# Patient Record
Sex: Female | Born: 1980 | Hispanic: Yes | Marital: Single | Smoking: Never smoker
Health system: Southern US, Community
[De-identification: ages and names within clinical notes are randomized; demographics above are authoritative.]

## PROBLEM LIST (undated history)

## (undated) ENCOUNTER — Emergency Department: Discharge status: Absent without leave | Payer: Self-pay

## (undated) ENCOUNTER — Inpatient Hospital Stay: Payer: Self-pay

## (undated) DIAGNOSIS — D649 Anemia, unspecified: Secondary | ICD-10-CM

## (undated) DIAGNOSIS — Z789 Other specified health status: Secondary | ICD-10-CM

## (undated) DIAGNOSIS — F329 Major depressive disorder, single episode, unspecified: Secondary | ICD-10-CM

## (undated) HISTORY — DX: Anemia, unspecified: D64.9

## (undated) HISTORY — DX: Major depressive disorder, single episode, unspecified: F32.9

## (undated) HISTORY — PX: NO PAST SURGERIES: SHX2092

---

## 2011-10-27 ENCOUNTER — Emergency Department: Payer: Self-pay | Admitting: Emergency Medicine

## 2011-10-27 LAB — CBC
HCT: 39.9 % (ref 35.0–47.0)
HGB: 13.2 g/dL (ref 12.0–16.0)
MCH: 28.4 pg (ref 26.0–34.0)
MCHC: 33 g/dL (ref 32.0–36.0)
Platelet: 186 10*3/uL (ref 150–440)
RBC: 4.63 10*6/uL (ref 3.80–5.20)
RDW: 13 % (ref 11.5–14.5)
WBC: 10.3 10*3/uL (ref 3.6–11.0)

## 2011-10-27 LAB — COMPREHENSIVE METABOLIC PANEL
BUN: 7 mg/dL (ref 7–18)
Bilirubin,Total: 0.4 mg/dL (ref 0.2–1.0)
Calcium, Total: 9 mg/dL (ref 8.5–10.1)
EGFR (African American): >60
EGFR (Non-African Amer.): >60
Glucose: 77 mg/dL (ref 65–99)
Osmolality: 274 (ref 275–301)
SGOT(AST): 17 U/L (ref 15–37)
SGPT (ALT): 31 U/L

## 2011-10-27 LAB — URINALYSIS, COMPLETE
Nitrite: NEGATIVE
Ph: 5 (ref 4.5–8.0)
Protein: 30
RBC,UR: NONE SEEN /HPF (ref 0–5)
Specific Gravity: 1.034 (ref 1.003–1.030)
WBC UR: NONE SEEN /HPF (ref 0–5)

## 2011-10-27 LAB — HCG, QUANTITATIVE, PREGNANCY: Beta Hcg, Quant.: 145447 m[IU]/mL — ABNORMAL HIGH

## 2011-11-15 ENCOUNTER — Emergency Department: Payer: Self-pay | Admitting: Emergency Medicine

## 2011-11-15 LAB — URINALYSIS, COMPLETE
Bilirubin,UR: NEGATIVE
Glucose,UR: NEGATIVE mg/dL (ref 0–75)
Protein: 30
RBC,UR: 3 /HPF (ref 0–5)
Squamous Epithelial: 40
WBC UR: 12 /HPF (ref 0–5)

## 2011-11-15 LAB — COMPREHENSIVE METABOLIC PANEL
Albumin: 3.7 g/dL (ref 3.4–5.0)
Alkaline Phosphatase: 78 U/L (ref 50–136)
BUN: 8 mg/dL (ref 7–18)
Bilirubin,Total: 0.3 mg/dL (ref 0.2–1.0)
EGFR (African American): >60
Glucose: 70 mg/dL (ref 65–99)
SGOT(AST): 22 U/L (ref 15–37)
SGPT (ALT): 32 U/L
Sodium: 137 mmol/L (ref 136–145)
Total Protein: 8.3 g/dL — ABNORMAL HIGH (ref 6.4–8.2)

## 2011-11-15 LAB — HCG, QUANTITATIVE, PREGNANCY: Beta Hcg, Quant.: 58452 m[IU]/mL — ABNORMAL HIGH

## 2011-11-15 LAB — CBC
HCT: 38.8 % (ref 35.0–47.0)
HGB: 13.1 g/dL (ref 12.0–16.0)
MCHC: 33.7 g/dL (ref 32.0–36.0)
RBC: 4.51 10*6/uL (ref 3.80–5.20)
WBC: 9.8 10*3/uL (ref 3.6–11.0)

## 2011-12-08 ENCOUNTER — Emergency Department: Payer: Self-pay | Admitting: Emergency Medicine

## 2011-12-08 LAB — URINALYSIS, COMPLETE
Bacteria: NONE SEEN
Bilirubin,UR: NEGATIVE
Glucose,UR: NEGATIVE mg/dL (ref 0–75)
Nitrite: NEGATIVE
Protein: NEGATIVE
Specific Gravity: 1.028 (ref 1.003–1.030)
WBC UR: 7 /HPF (ref 0–5)

## 2011-12-08 LAB — COMPREHENSIVE METABOLIC PANEL
Alkaline Phosphatase: 71 U/L (ref 50–136)
BUN: 7 mg/dL (ref 7–18)
Bilirubin,Total: 0.4 mg/dL (ref 0.2–1.0)
Calcium, Total: 9 mg/dL (ref 8.5–10.1)
Co2: 24 mmol/L (ref 21–32)
Creatinine: 0.34 mg/dL — ABNORMAL LOW (ref 0.60–1.30)
EGFR (Non-African Amer.): >60
Potassium: 4.7 mmol/L (ref 3.5–5.1)
SGPT (ALT): 37 U/L
Total Protein: 7.9 g/dL (ref 6.4–8.2)

## 2011-12-08 LAB — CBC
HGB: 12.9 g/dL (ref 12.0–16.0)
MCH: 30.1 pg (ref 26.0–34.0)
MCHC: 35.6 g/dL (ref 32.0–36.0)
MCV: 84 fL (ref 80–100)
Platelet: 187 10*3/uL (ref 150–440)
RBC: 4.31 10*6/uL (ref 3.80–5.20)
RDW: 13.3 % (ref 11.5–14.5)
WBC: 8.6 10*3/uL (ref 3.6–11.0)

## 2012-05-15 DIAGNOSIS — F32A Depression, unspecified: Secondary | ICD-10-CM

## 2012-05-15 DIAGNOSIS — D649 Anemia, unspecified: Secondary | ICD-10-CM

## 2012-05-15 HISTORY — DX: Depression, unspecified: F32.A

## 2012-05-15 HISTORY — DX: Anemia, unspecified: D64.9

## 2012-05-24 ENCOUNTER — Inpatient Hospital Stay: Payer: Self-pay | Admitting: Obstetrics and Gynecology

## 2012-05-24 LAB — CBC WITH DIFFERENTIAL/PLATELET
Basophil #: 0 10*3/uL (ref 0.0–0.1)
Basophil %: 0.4 %
Eosinophil #: 0.1 10*3/uL (ref 0.0–0.7)
HCT: 31.8 % — ABNORMAL LOW (ref 35.0–47.0)
HGB: 11.1 g/dL — ABNORMAL LOW (ref 12.0–16.0)
Lymphocyte %: 20.3 %
MCH: 29.9 pg (ref 26.0–34.0)
MCHC: 34.8 g/dL (ref 32.0–36.0)
MCV: 86 fL (ref 80–100)
Monocyte %: 5.5 %
Neutrophil %: 73 %
Platelet: 166 10*3/uL (ref 150–440)
RBC: 3.69 10*6/uL — ABNORMAL LOW (ref 3.80–5.20)
RDW: 13 % (ref 11.5–14.5)
WBC: 8.5 10*3/uL (ref 3.6–11.0)

## 2012-05-25 LAB — HEMATOCRIT: HCT: 33.8 % — ABNORMAL LOW (ref 35.0–47.0)

## 2014-01-07 IMAGING — US US OB < 14 WEEKS
1 series · 14 of 26 positions shown · non-contrast
Comparison: none

REASON FOR EXAM: repeated vomiting  10 weeks by dates  mild suprapubic
pain
COMMENTS:

[Series 1: us ob < 14 weeks · 0.26mm/px · 14 of 26 slices shown]
[im 1/26]
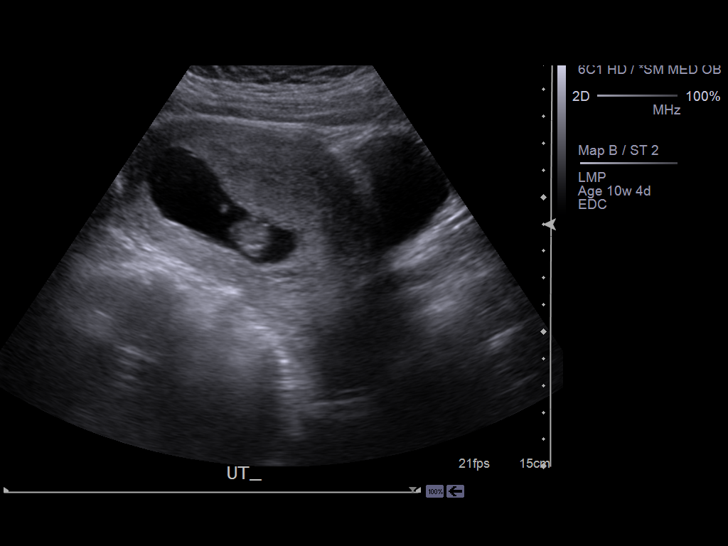
[im 3/26]
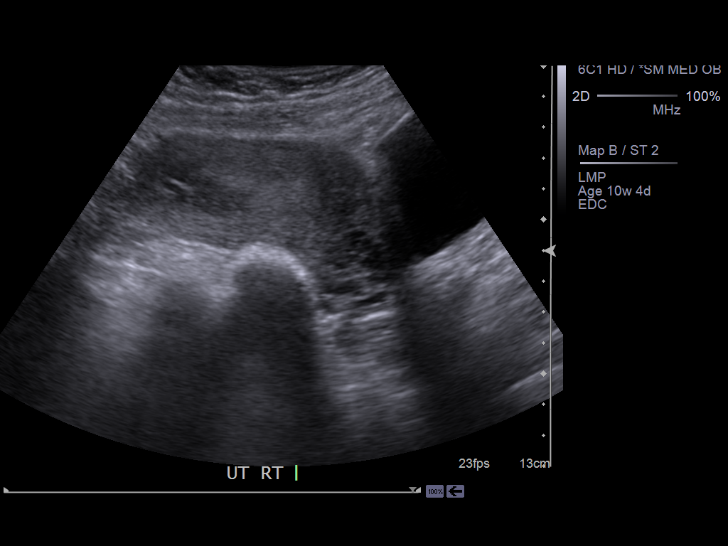
[im 5/26]
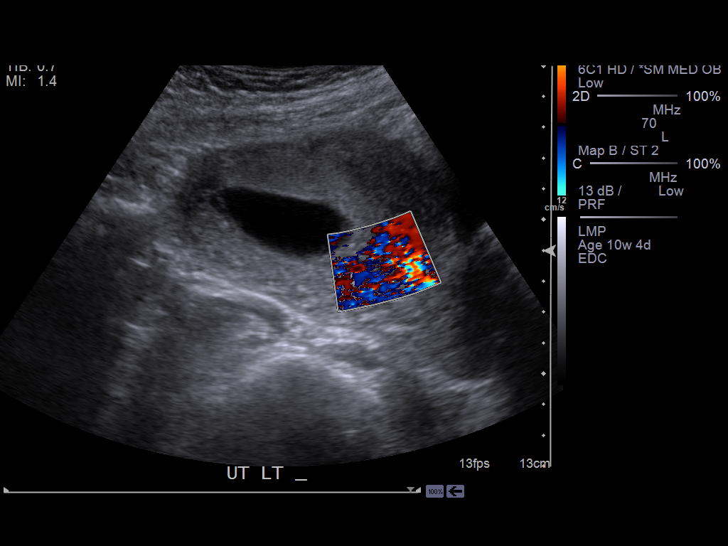
[im 7/26]
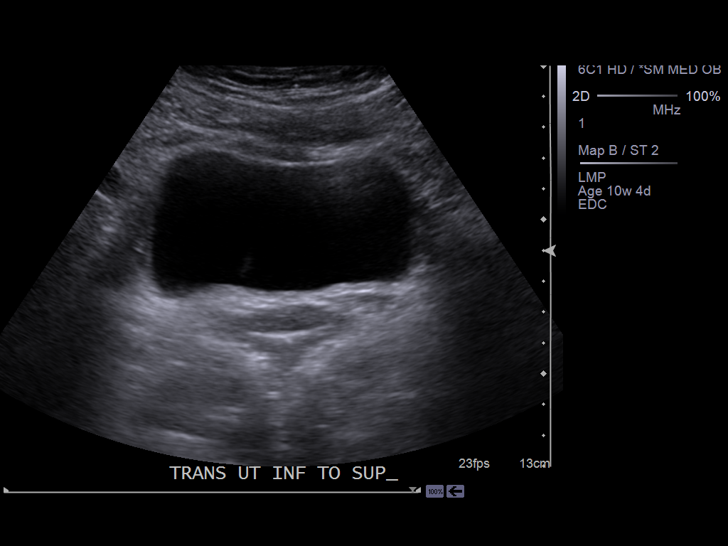
[im 9/26]
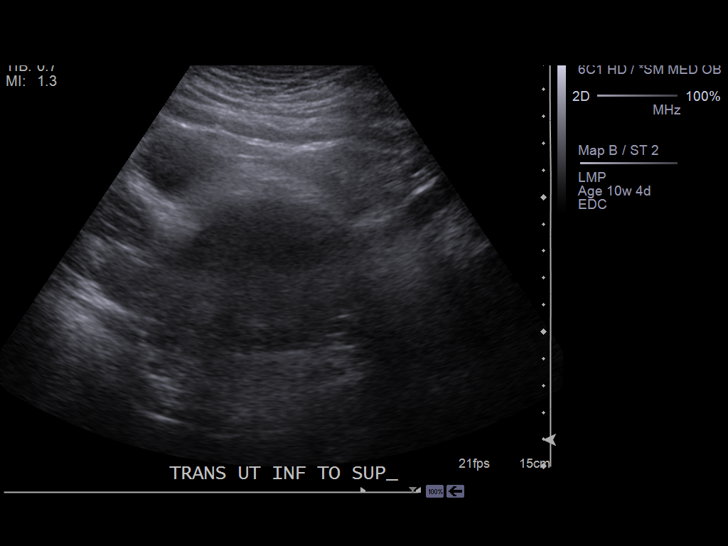
[im 11/26]
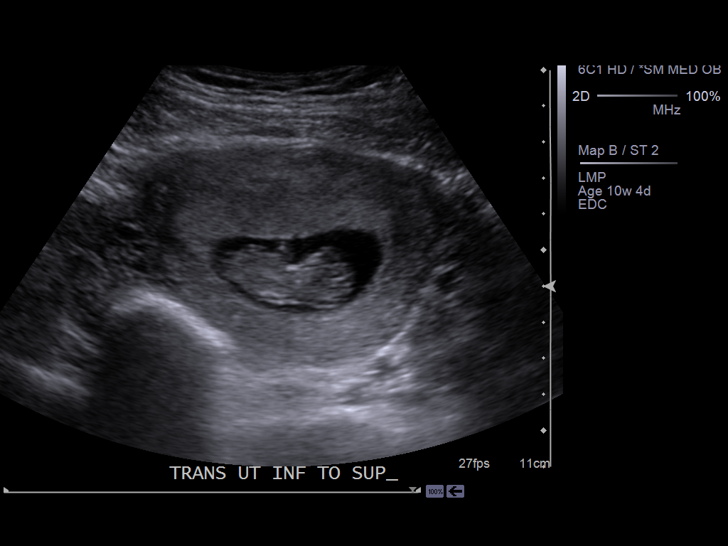
[im 13/26]
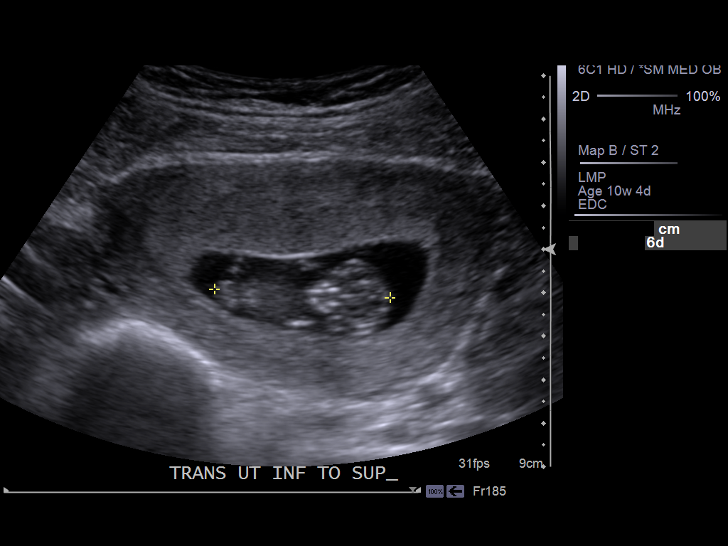
[im 14/26]
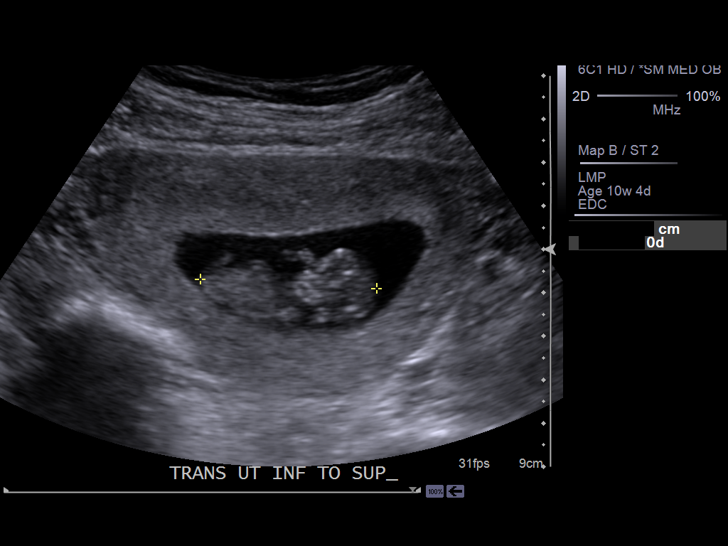
[im 16/26]
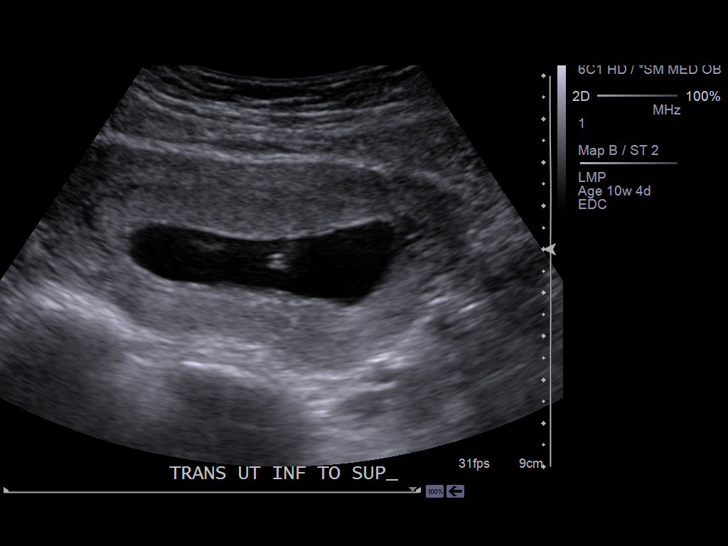
[im 18/26]
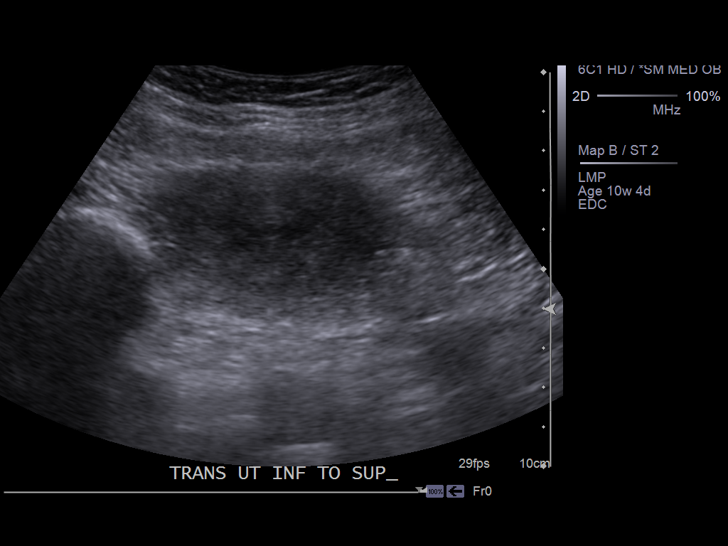
[im 20/26]
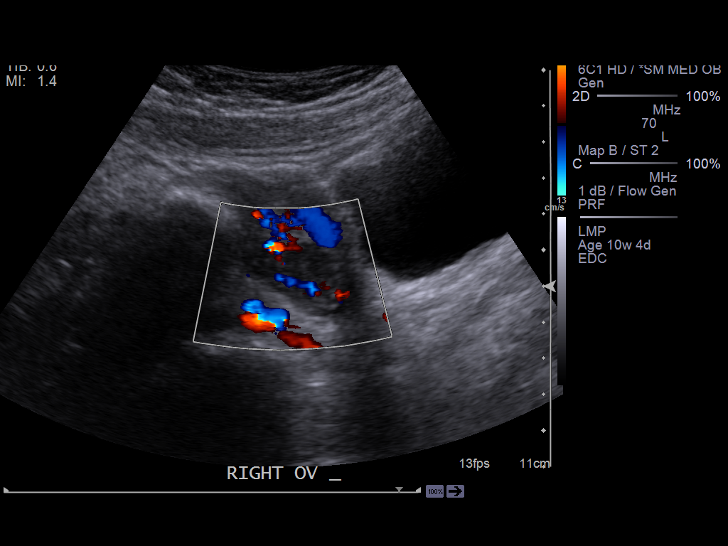
[im 22/26]
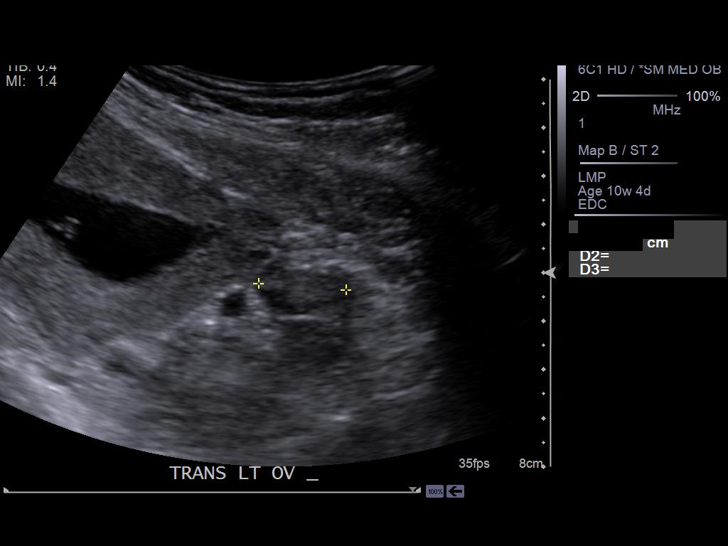
[im 24/26]
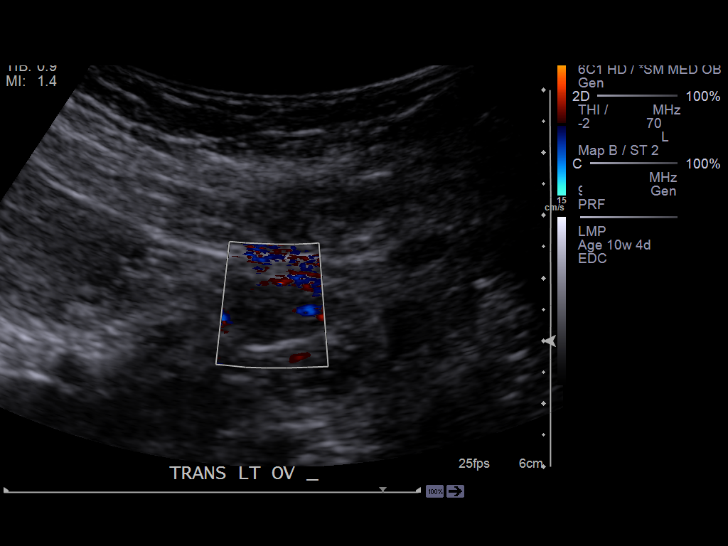
[im 26/26]
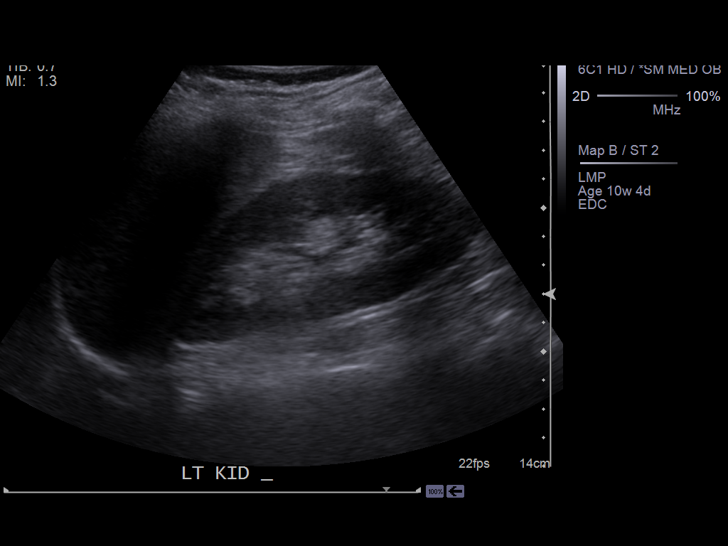

[14 of 26 positions shown; findings below may reference images not displayed]

PROCEDURE:     US  - US OB LESS THAN 14 WEEKS  - October 27, 2011  [DATE]

RESULT:     A gravid uterus is present. The crown-rump length of the fetus
measures 4.06 cm corresponding to an 11 weeks 0 day gestation. A yolk sac is
demonstrated. A fetal cardiac rate of 160 beats per minute was demonstrated.
There is no evidence of a subchorionic hemorrhage.

The maternal ovaries are normal in echotexture and right ovary measures
x 1.6 x 1.6 cm. The left ovary measures 3 x 1.8 x 1 cm. Vascularity of both
ovaries is normal. No suspicious masses are seen in the adnexal regions.
Survey views of the kidneys exhibit no acute abnormality.
IMPRESSION: There is an IUP with estimated gestational age of 11 weeks
0 days plus or minus approximately 7 days. The estimated date of confinement
is May 17, 2012. There is no evidence of a subchorionic hemorrhage nor
maternal adnexal abnormality.

Followup scanning at approximately 20 to 22 weeks would be useful for
biophysical evaluation.

## 2014-09-22 NOTE — H&P (Signed)
L&D Evaluation:  History:   HPI 34 y/[facility] G3P2002 [facility] HX precipitous deliveries x2. Well pregnancy, +CMZ rx'd. Denies leaking fluid, vaginal bleeding or regular contractions, baby is active, GBS negative.    Presents with IOL postdates    Patient's Medical History No Chronic Illness    Patient's Surgical History none    Medications Pre Natal Vitamins    Allergies NKDA    Social History none    Family History Non-Contributory   ROS:   ROS All systems were reviewed.  HEENT, CNS, GI, GU, Respiratory, CV, Renal and Musculoskeletal systems were found to be normal.   Exam:   Vital Signs stable    Urine Protein not completed    General no apparent distress    Mental Status clear    Chest clear    Heart normal sinus rhythm    Abdomen gravid, non-tender    Estimated Fetal Weight Average for gestational age    Fetal Position vtx    Fundal Height term    Back no CVAT    Edema no edema    Reflexes 1+    Clonus negative    Pelvic no external lesions, 3cm 50% vtx @ -2 BOWI sm show    Mebranes Intact    FHT normal rate with no decels, baseline 120's 130's avg variability with accels    FHT Description 132    Ucx irregular, Q 4/6 mins 45/60 sec moderate    Skin dry    Lymph no lymphadenopathy   Impression:   Impression IOL Term + HX precipitous deliveries   Plan:   Plan EFM/NST, monitor contractions and for cervical change    Comments Admitted, explained plan of care to pt and partner (interpreter paged) DC what to expect with IOL, plans natural childbirth/declines IV pain meds-epidural. Will begin pitocin.   Electronic Signatures: Albertina ParrLugiano, Shanley Furlough B (CNM)  (Signed 10-Jan-14 09:23)  Authored: L&D Evaluation   Last Updated: 10-Jan-14 09:23 by Albertina ParrLugiano, Marrah Vanevery B (CNM)

## 2014-10-06 LAB — HM PAP SMEAR: HM Pap smear: NEGATIVE

## 2015-09-06 LAB — HM PAP SMEAR: HM Pap smear: NEGATIVE

## 2016-02-03 ENCOUNTER — Other Ambulatory Visit: Payer: Self-pay | Admitting: Physician Assistant

## 2016-02-03 DIAGNOSIS — Z3481 Encounter for supervision of other normal pregnancy, first trimester: Secondary | ICD-10-CM

## 2016-02-03 DIAGNOSIS — Z3491 Encounter for supervision of normal pregnancy, unspecified, first trimester: Secondary | ICD-10-CM

## 2016-02-03 LAB — OB RESULTS CONSOLE ABO/RH: RH TYPE: POSITIVE

## 2016-02-03 LAB — OB RESULTS CONSOLE HIV ANTIBODY (ROUTINE TESTING): HIV: NONREACTIVE

## 2016-02-03 LAB — OB RESULTS CONSOLE RUBELLA ANTIBODY, IGM: Rubella: IMMUNE

## 2016-02-03 LAB — OB RESULTS CONSOLE VARICELLA ZOSTER ANTIBODY, IGG: Varicella: IMMUNE

## 2016-02-03 LAB — OB RESULTS CONSOLE RPR: RPR: NONREACTIVE

## 2016-02-11 ENCOUNTER — Ambulatory Visit
Admission: RE | Admit: 2016-02-11 | Discharge: 2016-02-11 | Disposition: A | Discharge status: Home / Self Care | Payer: Self-pay | Source: Ambulatory Visit | Attending: Physician Assistant | Admitting: Physician Assistant

## 2016-02-11 DIAGNOSIS — Z3481 Encounter for supervision of other normal pregnancy, first trimester: Secondary | ICD-10-CM | POA: Insufficient documentation

## 2016-02-11 DIAGNOSIS — Z3A16 16 weeks gestation of pregnancy: Secondary | ICD-10-CM | POA: Insufficient documentation

## 2016-02-11 DIAGNOSIS — Z3491 Encounter for supervision of normal pregnancy, unspecified, first trimester: Secondary | ICD-10-CM

## 2016-03-07 ENCOUNTER — Other Ambulatory Visit: Payer: Self-pay | Admitting: Physician Assistant

## 2016-03-07 ENCOUNTER — Ambulatory Visit
Admission: RE | Admit: 2016-03-07 | Discharge: 2016-03-07 | Disposition: A | Discharge status: Home / Self Care | Payer: Self-pay | Source: Ambulatory Visit | Attending: Physician Assistant | Admitting: Physician Assistant

## 2016-03-07 DIAGNOSIS — Z3491 Encounter for supervision of normal pregnancy, unspecified, first trimester: Secondary | ICD-10-CM

## 2016-03-07 DIAGNOSIS — Z3689 Encounter for other specified antenatal screening: Secondary | ICD-10-CM | POA: Insufficient documentation

## 2016-03-07 DIAGNOSIS — Z3A19 19 weeks gestation of pregnancy: Secondary | ICD-10-CM | POA: Insufficient documentation

## 2016-05-15 NOTE — L&D Delivery Note (Signed)
Delivery Note At 6:24 AM on 08/01/16 a viable female was delivered via Vaginal, Spontaneous Delivery (Presentation:ROA ;  ).  APGAR: 8, 9; weight  .  Delayed cord clamping  Placenta status: , .  Cord:  with the following complications: .  Cord pH: not done   Anesthesia:  none Episiotomy: None Lacerations: None Suture Repair: none Est. Blood Loss (mL):  200cc  Mom to postpartum.  Baby to Couplet care / Skin to Skin.  Kaliann Coryell 08/01/2016, 7:09 AM

## 2016-07-08 LAB — OB RESULTS CONSOLE GBS: STREP GROUP B AG: NEGATIVE

## 2016-07-27 ENCOUNTER — Observation Stay
Admission: EM | Admit: 2016-07-27 | Discharge: 2016-07-27 | Disposition: A | Discharge status: Home / Self Care | Payer: Self-pay | Attending: Obstetrics & Gynecology | Admitting: Obstetrics & Gynecology

## 2016-07-27 DIAGNOSIS — O479 False labor, unspecified: Secondary | ICD-10-CM | POA: Diagnosis present

## 2016-07-27 DIAGNOSIS — O99013 Anemia complicating pregnancy, third trimester: Secondary | ICD-10-CM | POA: Insufficient documentation

## 2016-07-27 DIAGNOSIS — O471 False labor at or after 37 completed weeks of gestation: Principal | ICD-10-CM | POA: Insufficient documentation

## 2016-07-27 DIAGNOSIS — Z3A38 38 weeks gestation of pregnancy: Secondary | ICD-10-CM | POA: Insufficient documentation

## 2016-07-27 NOTE — Plan of Care (Signed)
Pt returned from walking . efm reapplied . Small cervical change noted. Dr ward notified. Will continue to monitor

## 2016-07-27 NOTE — Plan of Care (Signed)
Dr ward notified of pt's presence on unit and assessment of labor. Will continue to monitor x 1 more hour and allow pt to ambulate.

## 2016-07-27 NOTE — Progress Notes (Signed)
No cervical change since last  sve. Reactive EFM. Irregular contractions

## 2016-07-27 NOTE — Discharge Summary (Signed)
Theresa Petty is a 36 y.o. female. She is at 3037w5d gestation. Patient's last menstrual period was 10/30/2015 (within days). Estimated Date of Delivery: 08/05/16  [facility]   Chief Complaint: contractions  S: Resting comfortably  Location:uterus Context:  Onset/timing/duration: patient has been having contractions every 7-10 minutes since last night Quality: crampy Severity: moderate Aggravating factors: n/a Alleviating factors: n/a Associated signs/symptoms: no VB.no LOF,  Active fetal movement.    Maternal Medical History:  Anemia  PSH: none  No Known Allergies  Prior to Admission medications   Medication Sig Start Date End Date Taking? Authorizing Provider  Prenatal Vit-Fe Fumarate-FA (MULTIVITAMIN-PRENATAL) 27-0.8 MG TABS tablet Take 1 tablet by mouth daily at 12 noon.   Yes Historical Provider, MD     Social History: She  reports that she has never smoked. She has never used smokeless tobacco. She reports that she does not use drugs.  Family History:  no history of gyn cancers  Review of Systems: A full review of systems was performed and negative except as noted in the HPI.     O:  BP 105/66 (BP Location: Right Arm)   Pulse 78   Temp 98 F (36.7 C) (Oral)   Resp 20   Ht 5\' 1"  (1.549 m)   Wt 67.1 kg (148 lb)   LMP 10/30/2015 (Within Days)   BMI 27.96 kg/m  No results found for this or any previous visit (from the past 48 hour(s)).   Constitutional: NAD, AAOx3  HE/ENT: extraocular movements grossly intact, moist mucous membranes CV: RRR PULM: nl respiratory effort, CTABL     Abd: gravid, non-tender, non-distended, soft      Ext: Non-tender, Nonedmeatous   Psych: mood appropriate, speech normal Pelvic: 3cm - remains unchanged since initial assessment  FHT: 125 mod + accels no decels TOCO: q7 min  A/P:  15XW [id number] G4P3003 with rule out labor .   Labor: not present.   Fetal Wellbeing: Reassuring Cat 1  tracing.  NST reactive  D/c home stable, precautions reviewed, follow-up as scheduled.   ----- Ranae Plumberhelsea Julieta Rogalski, MD Attending Obstetrician and Gynecologist [facility], Department of OB/GYN [facility]

## 2016-07-27 NOTE — Plan of Care (Signed)
Cervical exam = no change. Dr ward notified . Ok to send pt home. Pt d/c home with d/c instructions. Pt verbalized understanding of d/c instructions

## 2016-07-27 NOTE — OB Triage Note (Signed)
Pt had appointment at [facility] today. States they told her her cervix was 3.5 cm dilated and since she has a history of rapid labor to come to l/d .

## 2016-08-01 ENCOUNTER — Inpatient Hospital Stay
Admission: EM | Admit: 2016-08-01 | Discharge: 2016-08-02 | DRG: 775 | Disposition: A | Discharge status: Home / Self Care | Payer: Medicaid Other | Attending: Obstetrics and Gynecology | Admitting: Obstetrics and Gynecology

## 2016-08-01 DIAGNOSIS — Z3A39 39 weeks gestation of pregnancy: Secondary | ICD-10-CM | POA: Diagnosis not present

## 2016-08-01 DIAGNOSIS — Z3493 Encounter for supervision of normal pregnancy, unspecified, third trimester: Secondary | ICD-10-CM | POA: Diagnosis present

## 2016-08-01 HISTORY — DX: Other specified health status: Z78.9

## 2016-08-01 MED ORDER — SIMETHICONE 80 MG PO CHEW: 80.0000 mg | CHEWABLE_TABLET | ORAL | Status: DC | PRN

## 2016-08-01 MED ORDER — MEASLES, MUMPS & RUBELLA VAC ~~LOC~~ INJ
0.5000 mL | INJECTION | Freq: Once | SUBCUTANEOUS | Status: DC
  Filled 2016-08-01: qty 0.5

## 2016-08-01 MED ORDER — AMMONIA AROMATIC IN INHA
RESPIRATORY_TRACT | Status: AC
  Filled 2016-08-01: qty 10

## 2016-08-01 MED ORDER — MAGNESIUM HYDROXIDE 400 MG/5ML PO SUSP: 30.0000 mL | ORAL | Status: DC | PRN

## 2016-08-01 MED ORDER — LIDOCAINE HCL (PF) 1 % IJ SOLN
INTRAMUSCULAR | Status: AC
  Filled 2016-08-01: qty 30

## 2016-08-01 MED ORDER — ZOLPIDEM TARTRATE 5 MG PO TABS: 5.0000 mg | ORAL_TABLET | Freq: Every evening | ORAL | Status: DC | PRN

## 2016-08-01 MED ORDER — ONDANSETRON HCL 4 MG PO TABS: 4.0000 mg | ORAL_TABLET | ORAL | Status: DC | PRN

## 2016-08-01 MED ORDER — COCONUT OIL OIL: 1.0000 "application " | TOPICAL_OIL | Status: DC | PRN

## 2016-08-01 MED ORDER — OXYTOCIN 10 UNIT/ML IJ SOLN
INTRAMUSCULAR | Status: AC
  Filled 2016-08-01: qty 2

## 2016-08-01 MED ORDER — PRENATAL MULTIVITAMIN CH
1.0000 | ORAL_TABLET | Freq: Every day | ORAL | Status: DC
  Administered 2016-08-01 – 2016-08-02 (×2): 1 via ORAL
  Filled 2016-08-01 (×2): qty 1

## 2016-08-01 MED ORDER — SENNOSIDES-DOCUSATE SODIUM 8.6-50 MG PO TABS
2.0000 | ORAL_TABLET | ORAL | Status: DC
  Administered 2016-08-01: 2 via ORAL
  Filled 2016-08-01: qty 2

## 2016-08-01 MED ORDER — WITCH HAZEL-GLYCERIN EX PADS: 1.0000 "application " | MEDICATED_PAD | CUTANEOUS | Status: DC | PRN

## 2016-08-01 MED ORDER — ACETAMINOPHEN 325 MG PO TABS: 650.0000 mg | ORAL_TABLET | ORAL | Status: DC | PRN

## 2016-08-01 MED ORDER — IBUPROFEN 600 MG PO TABS
ORAL_TABLET | ORAL | Status: AC
  Filled 2016-08-01: qty 1

## 2016-08-01 MED ORDER — OXYTOCIN 40 UNITS IN LACTATED RINGERS INFUSION - SIMPLE MED
INTRAVENOUS | Status: AC
  Administered 2016-08-01: 40 [IU]
  Filled 2016-08-01: qty 1000

## 2016-08-01 MED ORDER — ONDANSETRON HCL 4 MG/2ML IJ SOLN: 4.0000 mg | INTRAMUSCULAR | Status: DC | PRN

## 2016-08-01 MED ORDER — BENZOCAINE-MENTHOL 20-0.5 % EX AERO: 1.0000 "application " | INHALATION_SPRAY | CUTANEOUS | Status: DC | PRN

## 2016-08-01 MED ORDER — DIPHENHYDRAMINE HCL 25 MG PO CAPS: 25.0000 mg | ORAL_CAPSULE | Freq: Four times a day (QID) | ORAL | Status: DC | PRN

## 2016-08-01 MED ORDER — IBUPROFEN 600 MG PO TABS
600.0000 mg | ORAL_TABLET | Freq: Four times a day (QID) | ORAL | Status: DC
  Administered 2016-08-01 – 2016-08-02 (×5): 600 mg via ORAL
  Filled 2016-08-01 (×4): qty 1

## 2016-08-01 MED ORDER — MISOPROSTOL 200 MCG PO TABS
ORAL_TABLET | ORAL | Status: AC
  Filled 2016-08-01: qty 4

## 2016-08-01 MED ORDER — FERROUS SULFATE 325 (65 FE) MG PO TABS
325.0000 mg | ORAL_TABLET | Freq: Two times a day (BID) | ORAL | Status: DC
  Administered 2016-08-01 – 2016-08-02 (×2): 325 mg via ORAL
  Filled 2016-08-01 (×2): qty 1

## 2016-08-01 MED ORDER — DIBUCAINE 1 % RE OINT: 1.0000 "application " | TOPICAL_OINTMENT | RECTAL | Status: DC | PRN

## 2016-08-01 NOTE — Discharge Summary (Signed)
Obstetric Discharge Summary   Patient ID: Patient Name: Theresa Petty DOB: 07-Jul-1980 MRN: [id number]  Date of Admission: 08/01/2016 Date of Discharge: 08/02/16  [facility]  Gestational Age at Delivery: 7554w3d   Antepartum complications: depression illiterate  Admitting Diagnosis: active labor   Secondary Diagnoses: Patient Active Problem List   Diagnosis Date Noted  . Vaginal delivery 08/01/2016  . Irregular uterine contractions 07/27/2016  . Labor and delivery, indication for care 07/27/2016    Augmentation: None Complications: None Intrapartum complications/course: Precipitous SVD  Date of Delivery: 08/01/2016 Delivered By: Dr Feliberto GottronSchermerhorn Delivery Type: spontaneous vaginal delivery Anesthesia: epidural Placenta: sponatneous Laceration:  Episiotomy: none  Newborn Data: Live born female  Birth Weight: 9 lb 1.7 oz (4130 g) APGAR: 8, 9      Postpartum Course  Patient had an uncomplicated postpartum course.  By time of discharge on PPD#1, her pain was controlled on oral pain medications; she had appropriate lochia and was ambulating, voiding without difficulty and tolerating regular diet.  She was deemed stable for discharge to home.     Labs: CBC Latest Ref Rng & Units 08/02/2016 05/25/2012 05/24/2012  WBC 3.6 - 11.0 K/uL 10.0 - 8.5  Hemoglobin 12.0 - 16.0 g/dL 11.1(L) - 11.1(L)  Hematocrit 35.0 - 47.0 % 31.9(L) 33.8(L) 31.8(L)  Platelets 150 - 440 K/uL 114(L) - 166   A  Physical exam:  BP 126/78 (BP Location: Right Arm)   Pulse 61   Temp 98.4 F (36.9 C) (Oral)   Resp 18   Ht 5\' 2"  (1.575 m)   Wt 77.1 kg (170 lb)   LMP 10/30/2015 (Within Days)   SpO2 100%   Breastfeeding? Unknown   BMI 31.09 kg/m  General: alert and no distress Pulm: normal respiratory effort Lochia: appropriate Abdomen: soft, NT Uterine Fundus: firm, below umbilicus Extremities: No evidence of DVT seen on physical exam. No lower extremity  edema.   Disposition: stable, discharge to home Baby Feeding: breastmilk  Baby Disposition: home with mom  Contraception: TBD  Prenatal Labs:   ABO, Rh: A/Positive/-- (09/21 0000) Antibody:   Rubella: Immune (09/21 0000) RPR: Nonreactive (09/21 0000)  HBsAg:    HIV: Non-reactive (09/21 0000)  GBS: Negative (02/24 0000)   Plan:  Theresa Petty was discharged to home in good condition. Follow-up appointment at  [facility] in 6 weeks  Discharge Instructions: Per After Visit Summary. Activity: Advance as tolerated. Pelvic rest for 6 weeks.  Refer to After Visit Summary Diet: Regular Discharge Medications: Allergies as of 08/02/2016   No Known Allergies     Medication List    TAKE these medications   ibuprofen 600 MG tablet Commonly known as:  ADVIL,MOTRIN Take 1 tablet (600 mg total) by mouth every 6 (six) hours.   multivitamin-prenatal 27-0.8 MG Tabs tablet Take 1 tablet by mouth daily at 12 noon.      Outpatient follow up:  Follow-up Information    [facility] Follow [facility] in 6 week(s).   Contact information: [address] [place] [id number] [phone]            Signed:  Elenora FenderChelsea C Danett Palazzo 08/02/16

## 2016-08-01 NOTE — H&P (Signed)
Deliliah Langston MaskerJaimes Aviles is a 36 y.o. female presenting for active labor . OB History    Gravida Para Term Preterm AB Living   4 3       3    SAB TAB Ectopic Multiple Live Births           3     Past Medical History:  Diagnosis Date  . Medical history non-contributory    Past Surgical History:  Procedure Laterality Date  . NO PAST SURGERIES     Family History: family history is not on file. Social History:  reports that she has never smoked. She has never used smokeless tobacco. She reports that she does not drink alcohol or use drugs.     Maternal Diabetes: No Genetic Screening: Normal Maternal Ultrasounds/Referrals: Normal Fetal Ultrasounds or other Referrals:  None Maternal Substance Abuse:  No Significant Maternal Medications:  None Significant Maternal Lab Results:  None Other Comments:  None  ROS History Dilation: Lip/rim Effacement (%): 90 Exam by:: BGean Quint. Nielsen RN Blood pressure 121/68, pulse (!) 58, temperature 98.5 F (36.9 C), temperature source Oral, resp. rate 15, height 5\' 2"  (1.575 m), weight 170 lb (77.1 kg), last menstrual period 10/30/2015, SpO2 100 %. Exam Physical Exam  Lungs CTA  CV RRR Adb soft NT  cx 8 cm / c/ +1  NST reassuring  Prenatal labs: ABO, Rh: A/Positive/-- (09/21 0000) Antibody:   Rubella: Immune (09/21 0000) RPR: Nonreactive (09/21 0000)  HBsAg:    HIV: Non-reactive (09/21 0000)  GBS: Negative (02/24 0000)   Assessment/Plan: Active labor  Rapid progression  Anticipate  SVD    Damarcus Reggio 08/01/2016, 4:03 PM

## 2016-08-02 LAB — CBC
HCT: 31.9 % — ABNORMAL LOW (ref 35.0–47.0)
HEMOGLOBIN: 11.1 g/dL — AB (ref 12.0–16.0)
MCH: 30.5 pg (ref 26.0–34.0)
MCHC: 34.9 g/dL (ref 32.0–36.0)
MCV: 87.4 fL (ref 80.0–100.0)
Platelets: 114 10*3/uL — ABNORMAL LOW (ref 150–440)
RBC: 3.65 MIL/uL — AB (ref 3.80–5.20)
RDW: 13.4 % (ref 11.5–14.5)
WBC: 10 10*3/uL (ref 3.6–11.0)

## 2016-08-02 MED ORDER — IBUPROFEN 600 MG PO TABS: 600.0000 mg | ORAL_TABLET | Freq: Four times a day (QID) | ORAL | 0 refills | Status: AC

## 2016-08-02 NOTE — Discharge Instructions (Signed)

## 2016-08-02 NOTE — Progress Notes (Signed)
Patient discharge to home via wheelchair with spouse and baby in car seat.  

## 2016-08-02 NOTE — Discharge Summary (Signed)
Patient presented for evaluation of labor.  Patient had cervical exam by RN and this was reported to me. I reviewed her vital signs and fetal tracing, both of which were reassuring.  Patient was discharged as she was not laboring.  NST interpretation: Reactive.  Adyn Hoes, MD Attending Obstetrician and Gynecologist [facility]   

## 2018-01-17 LAB — HIV ANTIBODY (ROUTINE TESTING W REFLEX): HIV: NONREACTIVE

## 2018-03-04 IMAGING — US US OB FOLLOW-UP
1 series · 13 of 28 positions shown · non-contrast
Comparison: none

CLINICAL DATA: 35-year-old gravida 4 para 3. Followup anatomy and
evaluate cervix.

EXAM:
OBSTETRIC 14+ WK ULTRASOUND FOLLOW-UP

[Series 1: us ob follow-up · 0.25mm/px · 13 of 105 slices shown]
[im 4/105]
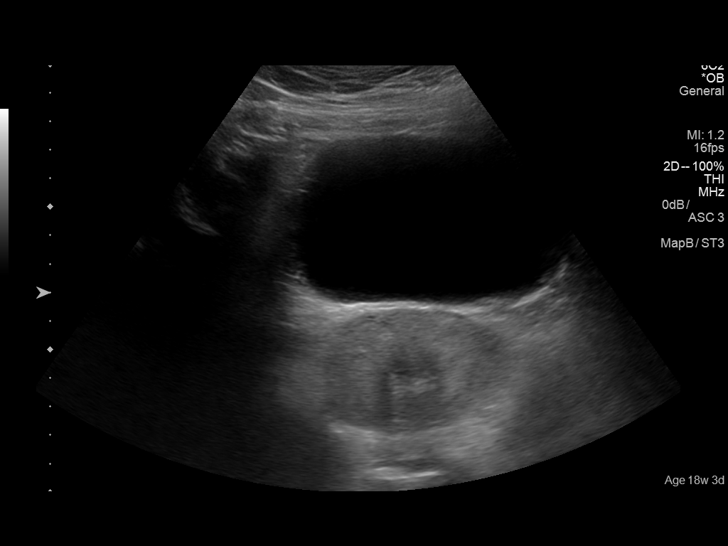
[im 12/105]
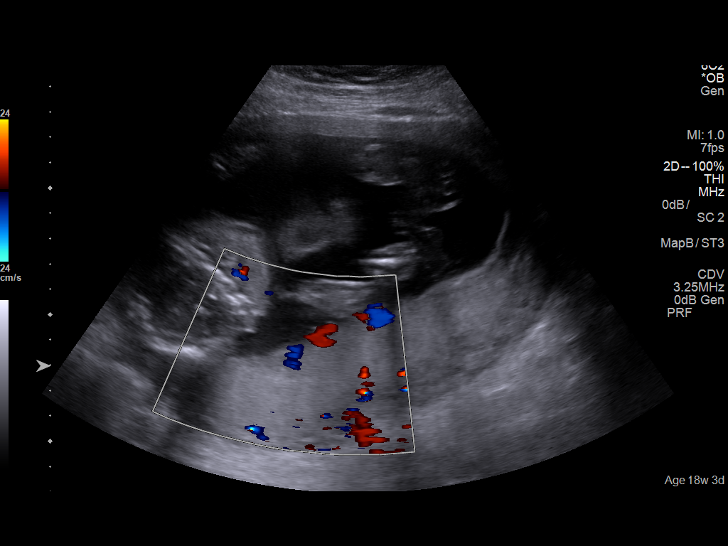
[im 20/105]
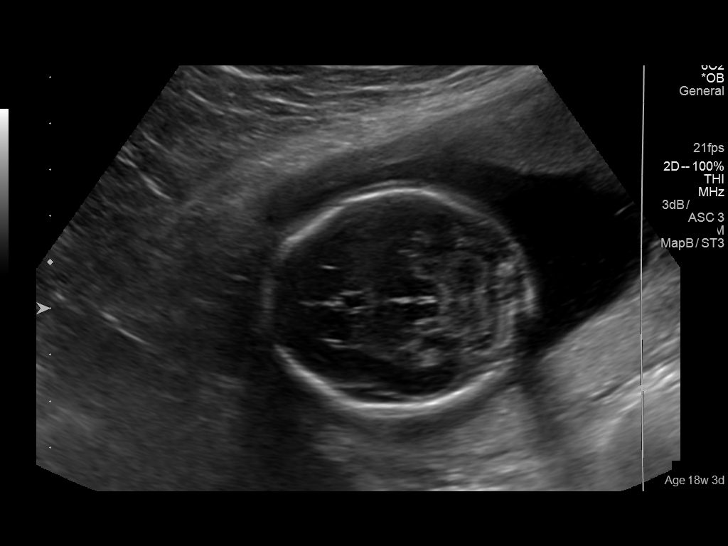
[im 27/105]
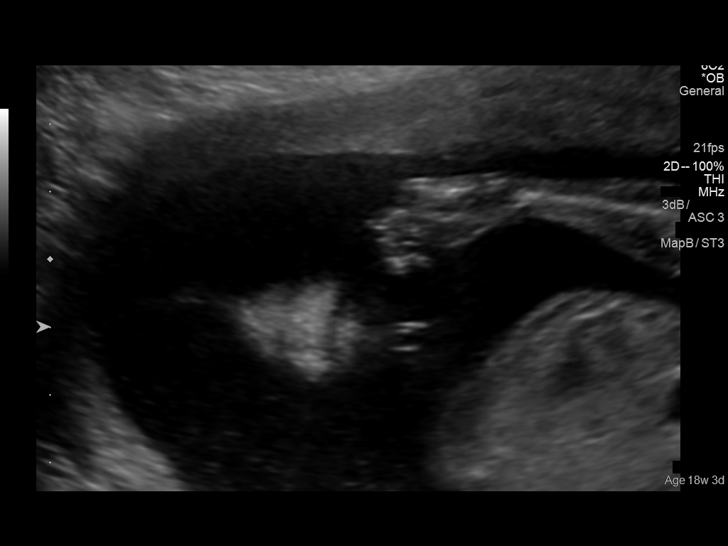
[im 35/105]
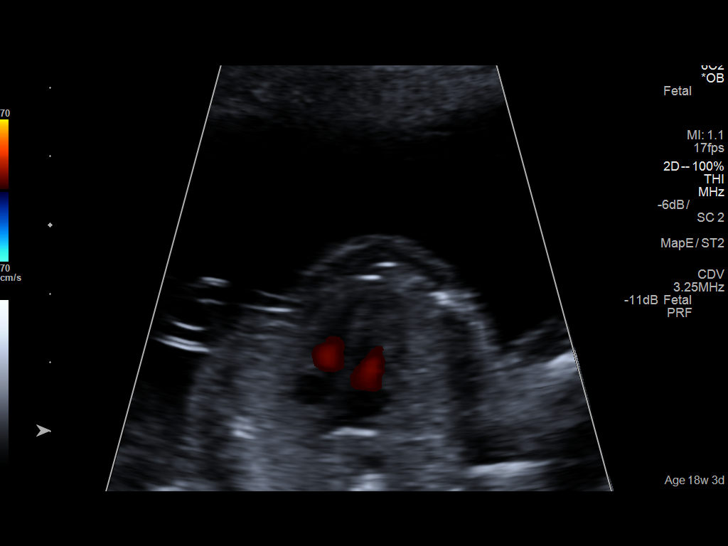
[im 43/105]
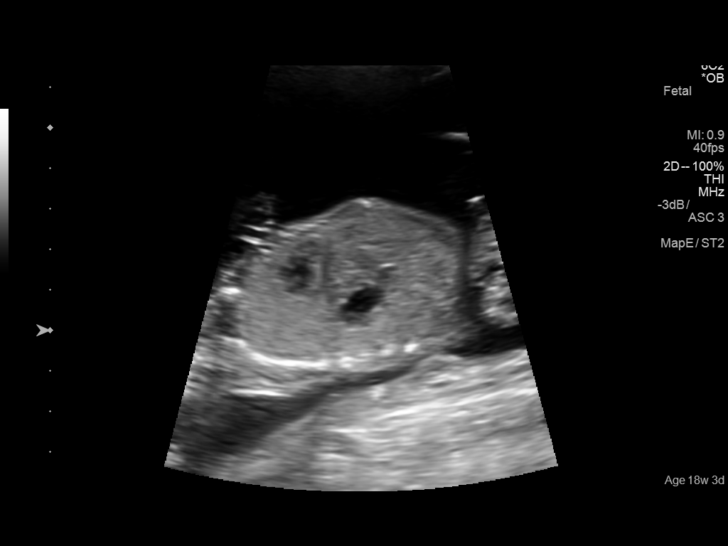
[im 54/105]
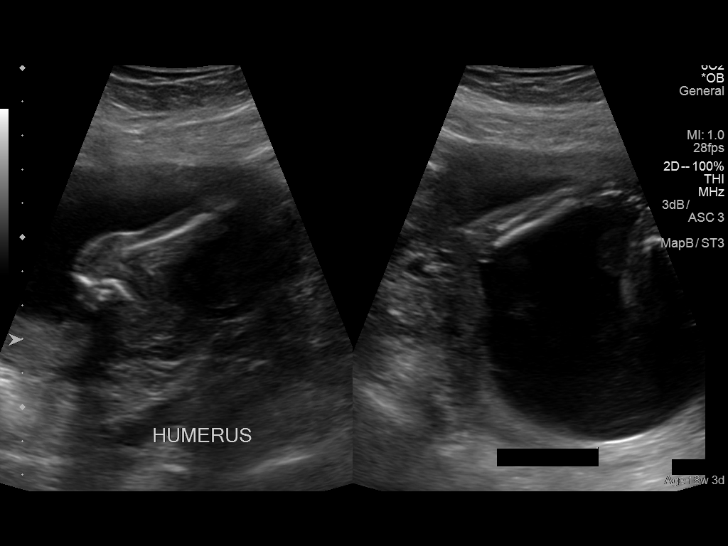
[im 62/105]
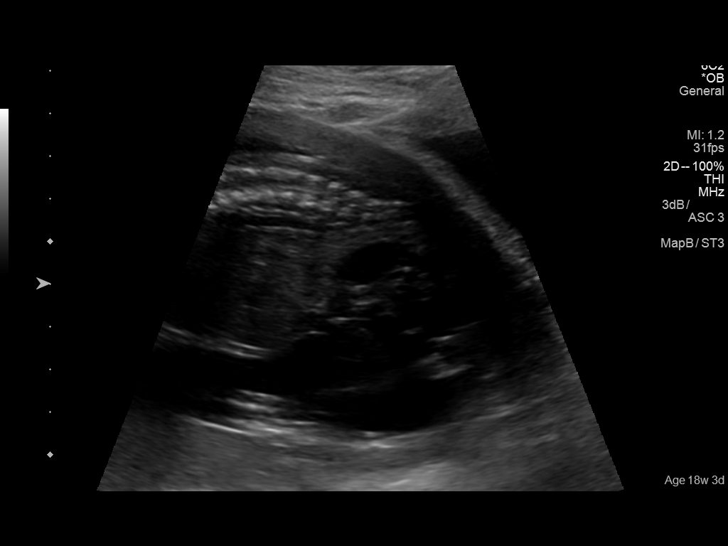
[im 70/105]
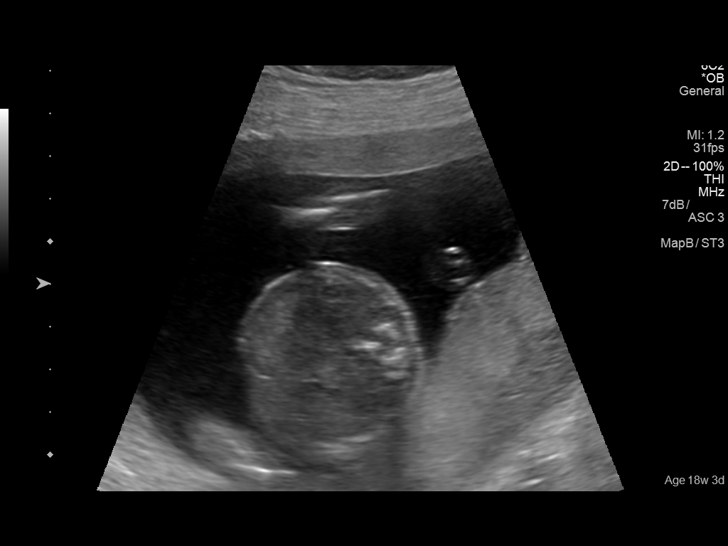
[im 78/105]
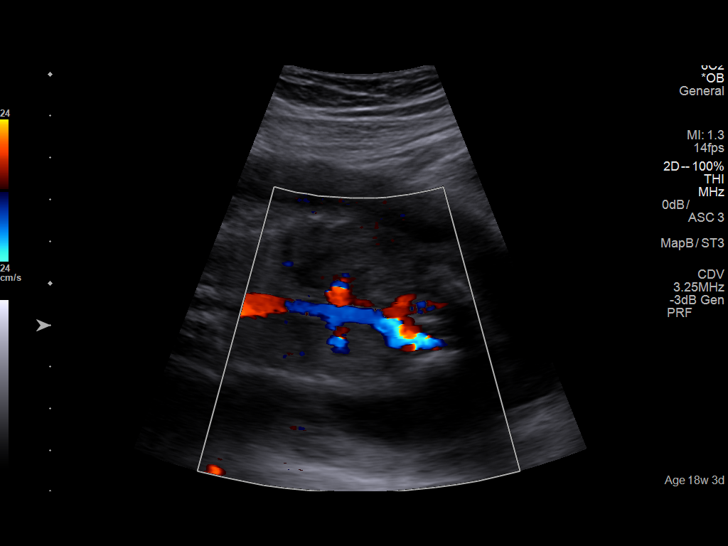
[im 85/105]
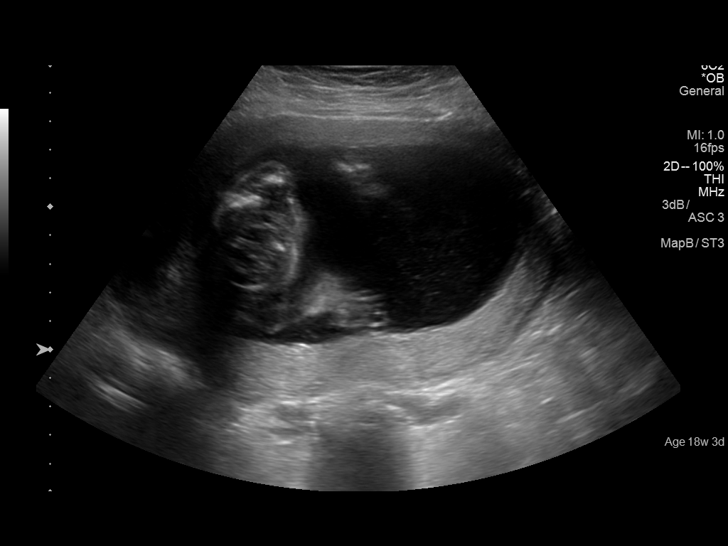
[im 93/105]
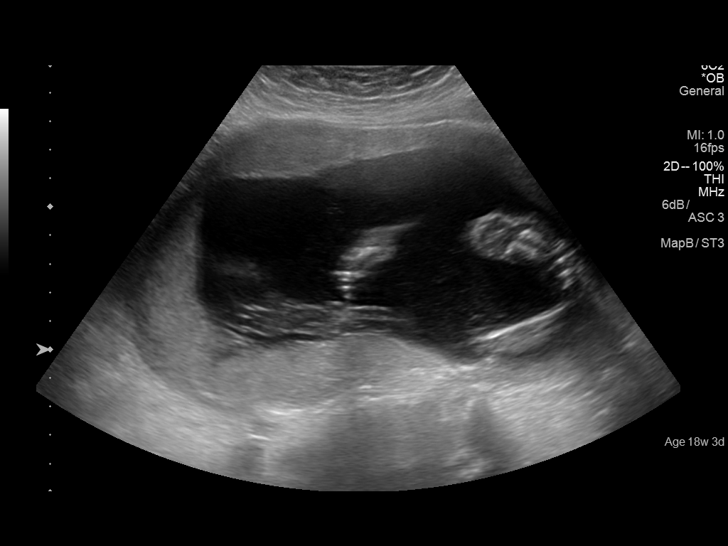
[im 101/105]
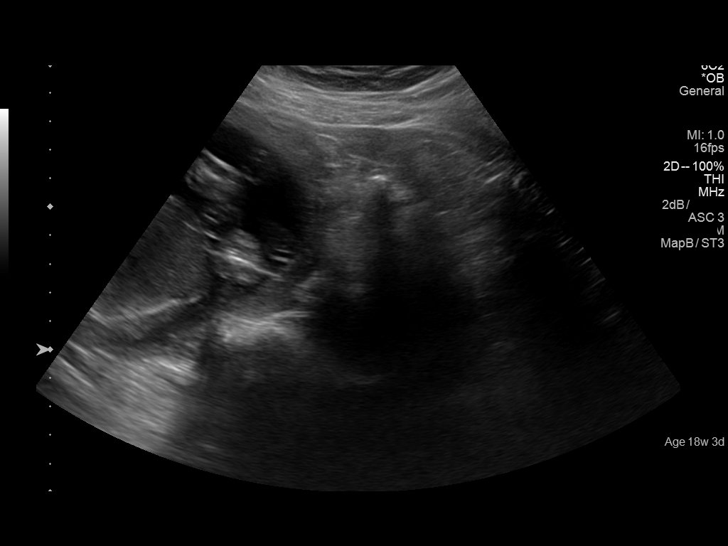

[13 of 28 positions shown; findings below may reference images not displayed]

FINDINGS: Number of Fetuses: 1

Heart Rate:  160 18 bpm

Movement: Present

Presentation: Transverse

Previa: None

Placental Location: Posterior

Amniotic Fluid (Subjective): Normal

Amniotic Fluid (Objective):

Vertical pocket 5.1cm

FETAL BIOMETRY

BPD:  4.7cm 20w   1d

HC:    16.9cm  19w   4d

AC:   15.4cm  20w   4d

FL:   3.1cm  19w   5d

Current Mean GA: 19w 6d          US EDC: 07/26/2016

EDC by LMP is 08/05/2013.  EDC by first ultrasound is 07/27/2016.

FETAL ANATOMY

Lateral Ventricles: Visualized

Thalami/CSP: Visualized

Posterior Fossa:  Visualized

Nuchal Region: Visualized    NFT= 3mm

Upper Lip: Visualized

Spine: Visualized

4 Chamber Heart on Left: Visualized

LVOT: Visualized

RVOT: Visualized

Stomach on Left: Visualized

3 Vessel Cord: Visualized

Cord Insertion site: Visualized

Kidneys: Visualized

Bladder: Visualized

Extremities: Visualized

Technically difficult due to: Not applicable

MATERNAL FINDINGS:

Cervix: 4.6 cm ; normal in appearance.
IMPRESSION: 1. Single living intrauterine fetus in transverse presentation.
2. Appropriate interval growth.
3. No fetal anomalies are identified.
4. Normal amniotic fluid volume.
5. Normal cervical length.  Cervix is normal in appearance.

## 2018-04-24 IMAGING — US US OB COMP +14 WK
1 of 2 series · 13 of 28 positions shown · non-contrast
Comparison: none

CLINICAL DATA: Pregnancy.  Size greater than dates.

EXAM:
OBSTETRICAL ULTRASOUND >14 WKS

[Series 1: us ob comp +14 wk · 0.22mm/px · 13 of 53 slices shown]
[im 3/53]
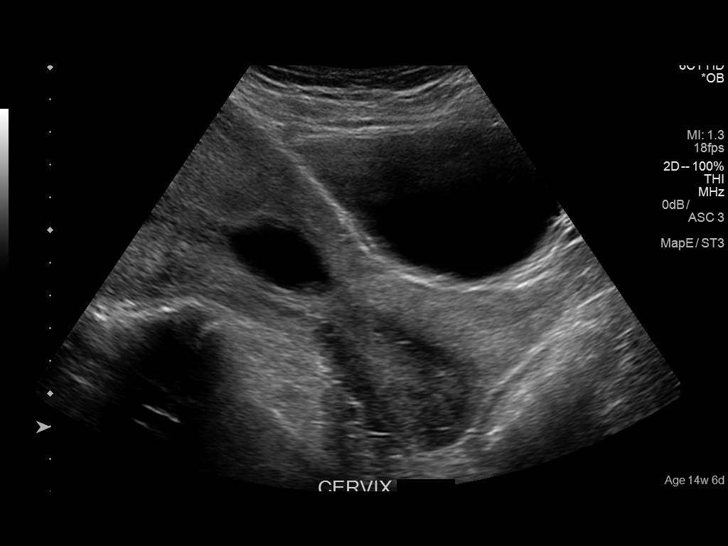
[im 7/53]
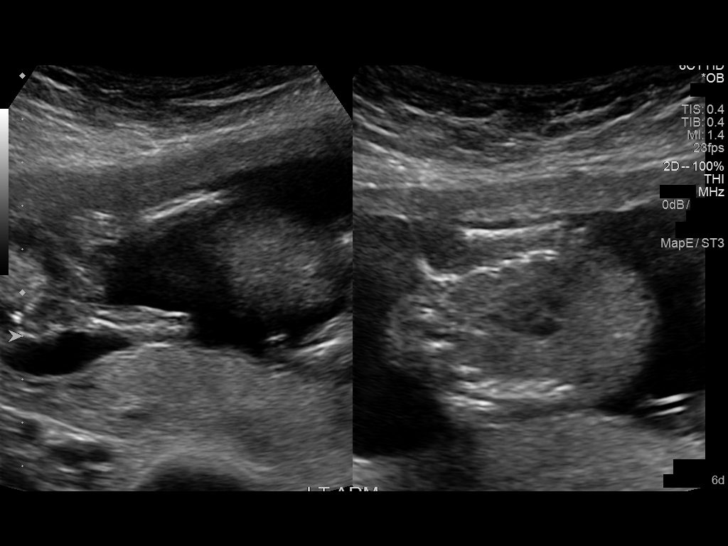
[im 11/53]
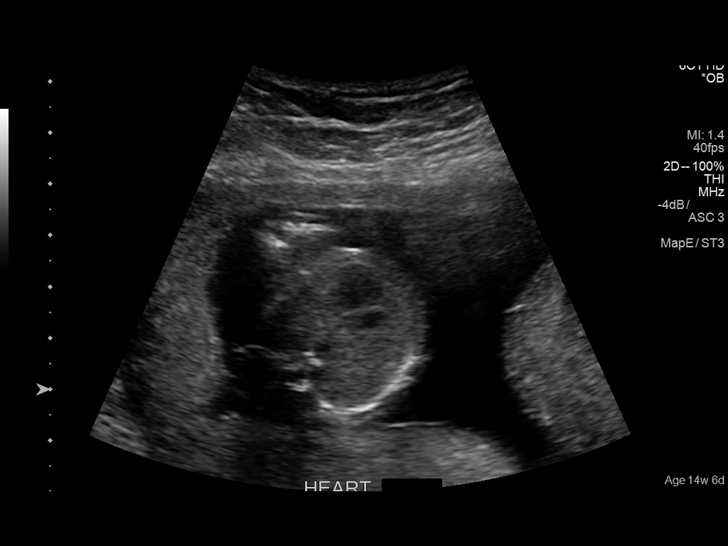
[im 15/53]
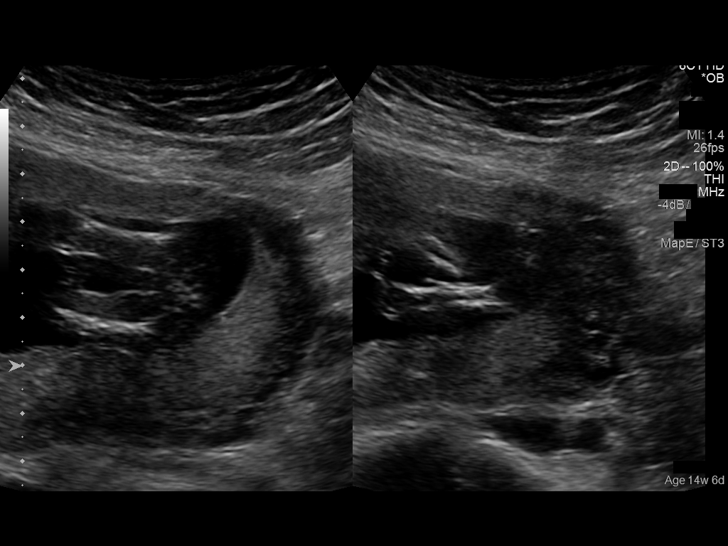
[im 19/53]
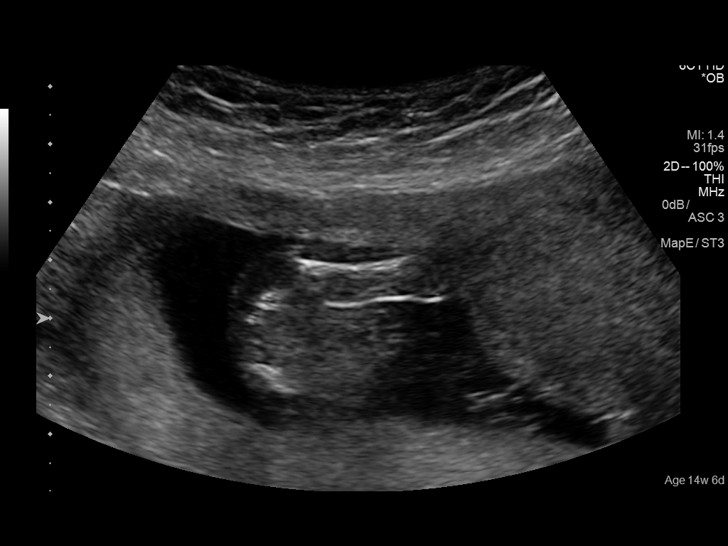
[im 23/53]
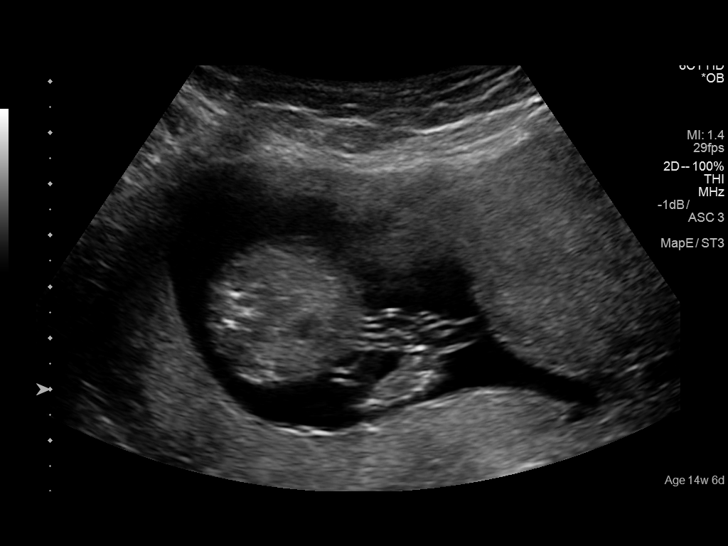
[im 29/53]
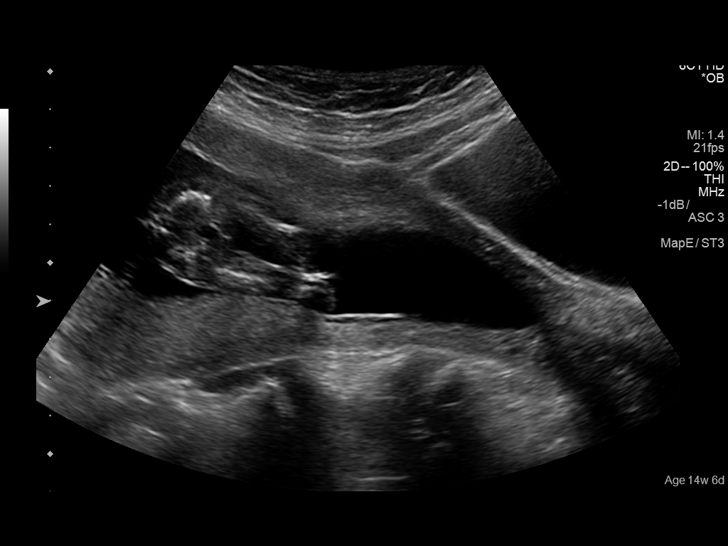
[im 33/53]
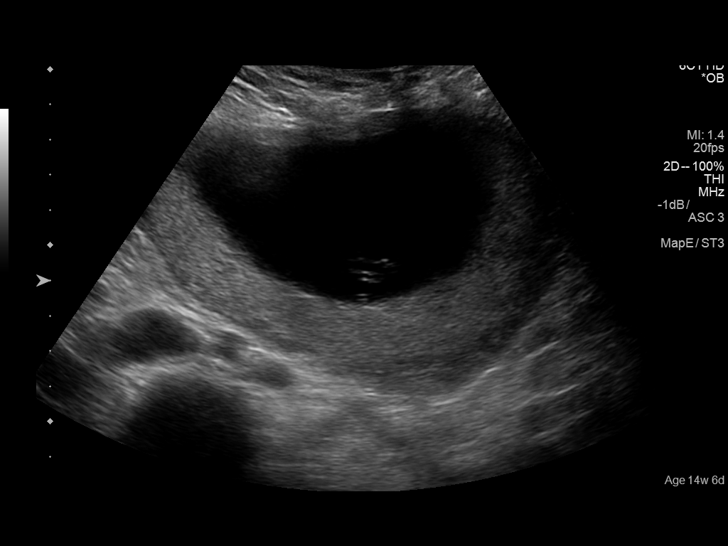
[im 37/53]
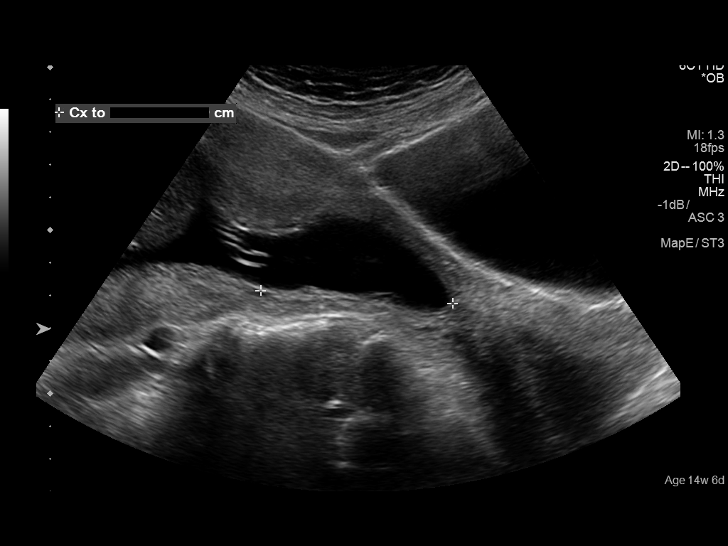
[im 41/53]
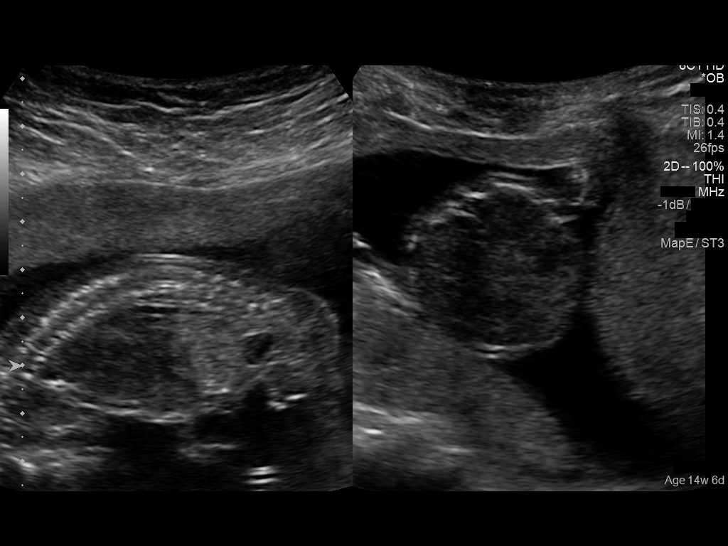
[im 45/53]
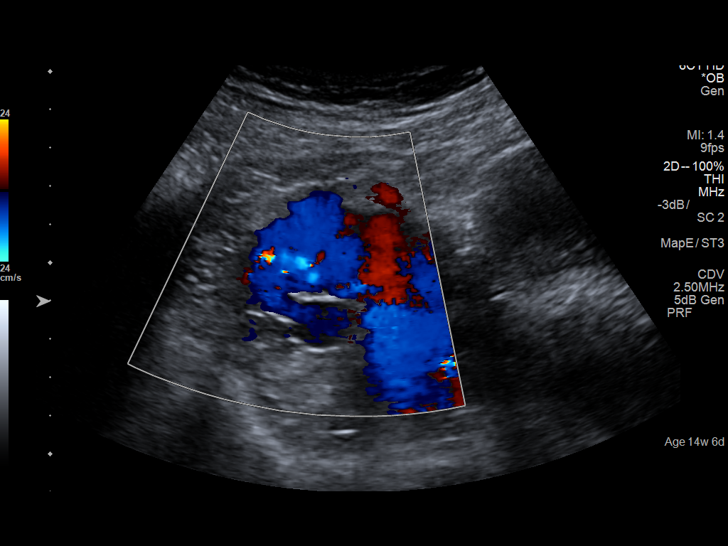
[im 49/53]
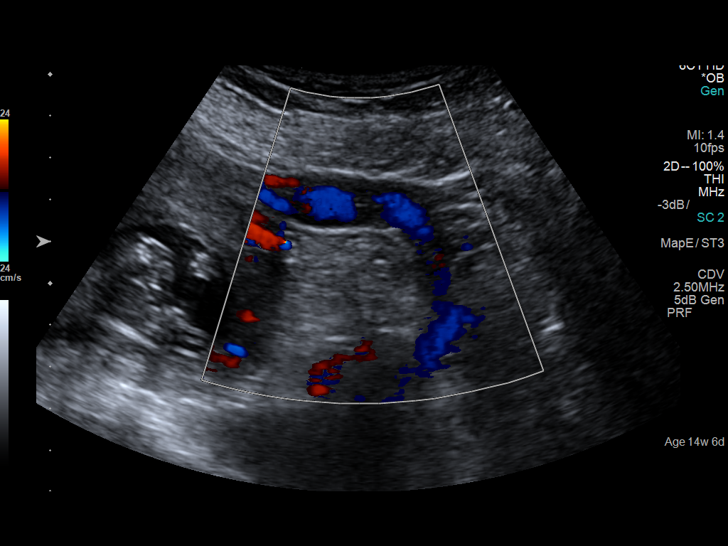
[im 53/53]
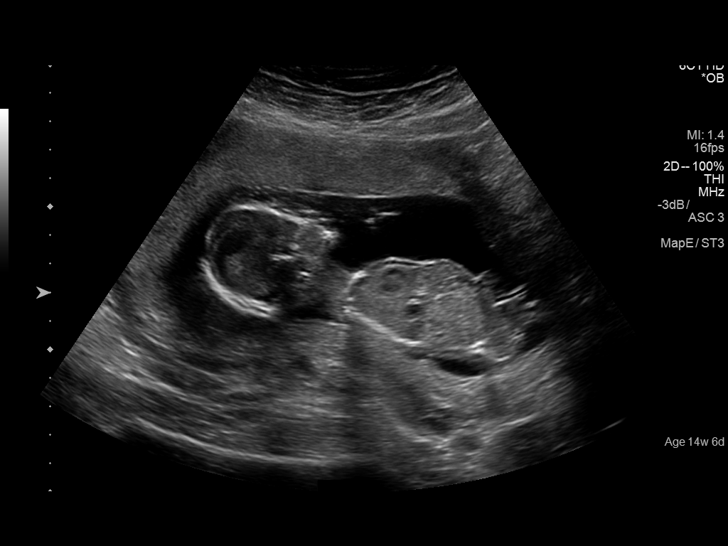

[13 of 28 positions shown; findings below may reference images not displayed]

FINDINGS: Number of Fetuses: 1

Heart Rate:  137 bpm

Movement: Present

Presentation: Transverse

Previa: No

Placental Location: Posterior

Amniotic Fluid (Subjective): Normal

Amniotic Fluid (Objective):

Vertical pocket 4.4cm

FETAL BIOMETRY

BPD:  3.5cm 16w 6d

HC:    12.8cm  6w   3d

AC:   10.9cm  16w   5d

FL:   1.9cm  15w   3d

Current Mean GA: 16w 1d              US EDC: 07/27/2016

FETAL ANATOMY

Exam limited by are gestational age.

Lateral Ventricles: Not visualized

Thalami/CSP: Not visualized

Posterior Fossa:  Not visual

Nuchal Region: Not visualized

Upper Lip: Not visualized

Spine: Visualized

4 Chamber Heart on Left: Visualized

LVOT: Not visualized

RVOT: Not visualized

Stomach on Left: Visualized

3 Vessel Cord: Visualized

Cord Insertion site: Not visualized

Kidneys: Not visualized

Bladder: Visualized

Extremities: Visualized

Maternal Findings:

Cervix: 5.5 cm and closed. Decreased echogenicity noted along the
posterior cervical wall. It is suggested the patient return for a
postvoid limited view of the pelvis to further evaluate the cervix
to exclude a cervical mass.
IMPRESSION: 1.  Single viable intrauterine pregnancy at 16 weeks 1 day.

2. Decreased echogenicity noted along the posterior cervical wall.
It is suggested that the patient return for post void limited view
of the pelvis to further evaluate the cervix to the cervical mass.

## 2018-06-17 DIAGNOSIS — Z550 Illiteracy and low-level literacy: Secondary | ICD-10-CM | POA: Insufficient documentation

## 2021-06-21 ENCOUNTER — Encounter: Payer: Self-pay | Admitting: Family Medicine

## 2021-06-21 ENCOUNTER — Ambulatory Visit (LOCAL_COMMUNITY_HEALTH_CENTER): Payer: Self-pay | Admitting: Family Medicine

## 2021-06-21 ENCOUNTER — Ambulatory Visit: Payer: Self-pay | Admitting: Family Medicine

## 2021-06-21 ENCOUNTER — Other Ambulatory Visit: Payer: Self-pay

## 2021-06-21 VITALS — BP 109/69 | Ht 60.0 in | Wt 139.6 lb

## 2021-06-21 DIAGNOSIS — Z3009 Encounter for other general counseling and advice on contraception: Secondary | ICD-10-CM

## 2021-06-21 NOTE — Progress Notes (Signed)
[facility] [place] Number: [phone]  Family Planning Visit- Repeat Yearly Visit  Subjective:  Theresa Petty is a 41 y.o. G4P3  being seen today for an annual wellness visit and to discuss contraception options.   The patient is currently using Hormonal Implant for pregnancy prevention. Patient does not want a pregnancy in the next year. Patient has the following medical problems: has Irregular uterine contractions; Labor and delivery, indication for care; Vaginal delivery; and Illiteracy on their problem list.  Chief Complaint  Patient presents with   Annual Exam   Contraception    extraction    Patient reports she is here for her annual physical.  She has a Nexplanon since 06/20/2018.  She states that she would like to continue with method.  Patient denies problems or concerns.  See flowsheet for other program required questions.   Body mass index is 27.26 kg/m. - Patient is eligible for diabetes screening based on BMI and age >79?  Yes. Client receives care at [facility] ordered? No.  Will be tested at [facility].  Patient reports 1 of partners in last year. Desires STI screening?  No - declines.   Has patient been screened once for HCV in the past?  No  No results found for: HCVAB  Does the patient have current of drug use, have a partner with drug use, and/or has been incarcerated since last result? No  If yes-- Screen for HCV through [facility]   Does the patient meet criteria for HBV testing? No  Criteria:  -Household, sexual or needle sharing contact with HBV -History of drug use -HIV positive -Those with known Hep C   Health Maintenance Due  Topic Date Due   COVID-19 Vaccine (1) Never done   Hepatitis C Screening  Never done   TETANUS/TDAP  Never done   PAP SMEAR-Modifier  09/06/2018   INFLUENZA VACCINE  Never done    Review of Systems  All other systems reviewed and  are negative.  The following portions of the patient's history were reviewed and updated as appropriate: allergies, current medications, past family history, past medical history, past social history, past surgical history and problem list. Problem list updated.  Objective:   Vitals:   06/21/21 1439  BP: 109/69  Weight: 139 lb 9.6 oz (63.3 kg)  Height: 5' (1.524 m)    Physical Exam Vitals and nursing note reviewed.  Constitutional:      Appearance: Normal appearance.  HENT:     Head: Normocephalic and atraumatic.  Pulmonary:     Effort: Pulmonary effort is normal.  Abdominal:     Palpations: Abdomen is soft.     Hernia: There is no hernia in the left inguinal area or right inguinal area.  Genitourinary:    General: Normal vulva.     Exam position: Lithotomy position.     Labia:        Right: No rash, tenderness, lesion or injury.        Left: No rash, tenderness, lesion or injury.      Vagina: Normal.     Cervix: Normal.     Rectum: Normal.     Comments: Bimanual deferred  Musculoskeletal:        General: Normal range of motion.  Lymphadenopathy:     Lower Body: No right inguinal adenopathy. No left inguinal adenopathy.  Skin:    General: Skin is warm and dry.  Neurological:     General: No focal deficit present.     Mental Status: She is alert. Mental status is at baseline.  Psychiatric:        Mood and Affect: Mood normal.        Behavior: Behavior normal.      Assessment and Plan:  Theresa Petty is a 41 y.o. female G4P3 presenting to the [facility] Department for an yearly wellness and contraception visit  Contraception counseling: Reviewed all forms of birth control options in the tiered based approach. available including abstinence; over the counter/barrier methods; hormonal contraceptive medication including pill, patch, ring, injection,contraceptive implant, ECP; hormonal and nonhormonal IUDs; permanent sterilization options including  vasectomy and the various tubal sterilization modalities. Risks, benefits, and typical effectiveness rates were reviewed.  Questions were answered.  Written information was also given to the patient to review.  Patient desires to continue with the Elmer.  She will follow up in  1 year for surveillance.    was told to call with any further questions, or with any concerns about this method of contraception.  Emphasized use of condoms 100% of the time for STI prevention.  Patient was offered not offered based on continuous use of Nexplanon   1. Family planning  - IGP, Aptima HPV -Nexplanon surveillance.    Return in about 1 year (around 06/21/2022).  No future appointments.  Hassell Done, FNP

## 2021-06-21 NOTE — Progress Notes (Signed)
Pt here for PE and Pap.   Pt had a Nexplanon placed 06/20/2018 and will have it removed in 2024.  Pt declined condoms.  Windle Guard, RN

## 2021-06-21 NOTE — Progress Notes (Signed)
Theresa Petty [phone]

## 2021-06-24 LAB — IGP, APTIMA HPV
HPV Aptima: NEGATIVE
PAP Smear Comment: 0

## 2021-10-31 NOTE — Progress Notes (Unsigned)
PAP Normal and HPV Negative.  Repeat PAP in 5 years (06-2026) per Lyndel Safe, MD. [place] PAP card mailed today.  Routed 10-31-2021.  Hart Carwin, RN

## 2021-10-31 NOTE — Progress Notes (Unsigned)
Late Entry pap smear triage  NIL, HPV negative  Next pap in 5 years

## 2023-10-22 ENCOUNTER — Ambulatory Visit: Payer: Self-pay

## 2023-10-22 VITALS — BP 112/55 | Ht 60.0 in | Wt 150.5 lb

## 2023-10-22 DIAGNOSIS — Z3201 Encounter for pregnancy test, result positive: Secondary | ICD-10-CM

## 2023-10-22 DIAGNOSIS — Z3009 Encounter for other general counseling and advice on contraception: Secondary | ICD-10-CM

## 2023-10-22 LAB — PREGNANCY, URINE: Preg Test, Ur: POSITIVE — AB

## 2023-10-22 MED ORDER — PRENATAL 27-0.8 MG PO TABS: 1.0000 | ORAL_TABLET | Freq: Every day | ORAL | Status: AC

## 2023-10-22 NOTE — Progress Notes (Signed)
 UPT positive. Positive preg packet given and reviewed. Plans prenatal care at [facility].  The patient was dispensed prenatal vitamins #100 today per [facility]  Dr Bohdan Bush. I provided counseling today regarding the medication. We discussed the medication, the side effects and when to call clinic. Patient given the opportunity to ask questions. Questions answered.    Sent to clerk for presumptive elig/medicaid/preg women and to schedule new OB appt. Melven Stable [place] interpreter. Myrka Sylva, RN

## 2023-10-25 ENCOUNTER — Emergency Department
Admission: EM | Admit: 2023-10-25 | Discharge: 2023-10-25 | Disposition: A | Discharge status: Home / Self Care | Payer: Self-pay | Attending: Emergency Medicine | Admitting: Emergency Medicine

## 2023-10-25 ENCOUNTER — Other Ambulatory Visit: Payer: Self-pay

## 2023-10-25 ENCOUNTER — Emergency Department: Payer: Self-pay

## 2023-10-25 DIAGNOSIS — O0289 Other abnormal products of conception: Secondary | ICD-10-CM | POA: Insufficient documentation

## 2023-10-25 LAB — CBC WITH DIFFERENTIAL/PLATELET
Abs Immature Granulocytes: 0.01 10*3/uL (ref 0.00–0.07)
Basophils Absolute: 0 10*3/uL (ref 0.0–0.1)
Basophils Relative: 0 %
Eosinophils Absolute: 0.3 10*3/uL (ref 0.0–0.5)
Eosinophils Relative: 3 %
HCT: 38.4 % (ref 36.0–46.0)
Hemoglobin: 12.9 g/dL (ref 12.0–15.0)
Immature Granulocytes: 0 %
Lymphocytes Relative: 30 %
Lymphs Abs: 2.8 10*3/uL (ref 0.7–4.0)
MCH: 29 pg (ref 26.0–34.0)
MCHC: 33.6 g/dL (ref 30.0–36.0)
MCV: 86.3 fL (ref 80.0–100.0)
Monocytes Absolute: 0.6 10*3/uL (ref 0.1–1.0)
Monocytes Relative: 6 %
Neutro Abs: 5.7 10*3/uL (ref 1.7–7.7)
Neutrophils Relative %: 61 %
Platelets: 205 10*3/uL (ref 150–400)
RBC: 4.45 MIL/uL (ref 3.87–5.11)
RDW: 12.2 % (ref 11.5–15.5)
WBC: 9.3 10*3/uL (ref 4.0–10.5)
nRBC: 0 % (ref 0.0–0.2)

## 2023-10-25 LAB — BASIC METABOLIC PANEL WITH GFR
Anion gap: 7 (ref 5–15)
BUN: 8 mg/dL (ref 6–20)
CO2: 25 mmol/L (ref 22–32)
Calcium: 9.2 mg/dL (ref 8.9–10.3)
Chloride: 105 mmol/L (ref 98–111)
Creatinine, Ser: 0.48 mg/dL (ref 0.44–1.00)
GFR, Estimated: >60 mL/min (ref >60)
Glucose, Bld: 67 mg/dL — ABNORMAL LOW (ref 70–99)
Potassium: 3.5 mmol/L (ref 3.5–5.1)
Sodium: 137 mmol/L (ref 135–145)

## 2023-10-25 LAB — URINALYSIS, ROUTINE W REFLEX MICROSCOPIC
Bilirubin Urine: NEGATIVE
Glucose, UA: NEGATIVE mg/dL
Ketones, ur: NEGATIVE mg/dL
Nitrite: NEGATIVE
Protein, ur: NEGATIVE mg/dL
Specific Gravity, Urine: 1.024 (ref 1.005–1.030)
pH: 6 (ref 5.0–8.0)

## 2023-10-25 LAB — HCG, QUANTITATIVE, PREGNANCY: hCG, Beta Chain, Quant, S: 9123 m[IU]/mL — ABNORMAL HIGH (ref <5)

## 2023-10-25 LAB — POC URINE PREG, ED: Preg Test, Ur: POSITIVE — AB

## 2023-10-25 LAB — ABO/RH: ABO/RH(D): A POS

## 2023-10-25 NOTE — Discharge Instructions (Addendum)
 You were evaluated in the ED for vaginal bleeding.  Your lab work is reassuring.  Your ultrasound reveals a nonviable pregnancy as there is no cardiac activity of the fetus.  Please get plenty of rest and slowly return to your normal activities as your energy level is low allow.  Take Tylenol  as needed for cramping or discomfort.  Please follow-up with your OB/GYN in 3 days if not sooner for further management.  If bleeding or pain worsens please return to ED.

## 2023-10-25 NOTE — ED Provider Notes (Signed)
 [facility] Emergency Department Provider Note     Event Date/Time   First MD Initiated Contact with Patient 10/25/23 2032     (approximate)   History   Vaginal Bleeding   HPI  Theresa Petty is a 43 y.o. female [id number] presents to the ED for evaluation of vaginal bleeding x 5 days.  She reports being approximately 3 months pregnant but has not established prenatal care.  She reports [facility].  Endorses intermittent abdominal cramping.  No passage of tissue reported. States only spotting and when she wipes. No fever, chills, nausea or vomiting.     Physical Exam   Triage Vital Signs: ED Triage Vitals  Encounter Vitals Group     BP 10/25/23 1924 135/75     Girls Systolic BP Percentile --      Girls Diastolic BP Percentile --      Boys Systolic BP Percentile --      Boys Diastolic BP Percentile --      Pulse Rate 10/25/23 1924 84     Resp 10/25/23 1924 18     Temp 10/25/23 1924 98.5 F (36.9 C)     Temp src --      SpO2 10/25/23 1924 100 %     Weight 10/25/23 1923 150 lb (68 kg)     Height 10/25/23 1923 5' (1.524 m)     Head Circumference --      Peak Flow --      Pain Score 10/25/23 1923 7     Pain Loc --      Pain Education --      Exclude from Growth Chart --     Most recent vital signs: Vitals:   10/25/23 1924 10/25/23 2238  BP: 135/75 133/71  Pulse: 84 89  Resp: 18 18  Temp: 98.5 F (36.9 C)   SpO2: 100% 100%    General Awake, no distress.  Well-appearing HEENT NCAT.  CV:  Good peripheral perfusion.  RESP:  Normal effort.  ABD:  No distention.   ED Results / Procedures / Treatments   Labs (all labs ordered are listed, but only abnormal results are displayed) Labs Reviewed  BASIC METABOLIC PANEL WITH GFR - Abnormal; Notable for the following components:      Result Value   Glucose, Bld 67 (*)    All other components within normal limits  HCG, QUANTITATIVE, PREGNANCY -  Abnormal; Notable for the following components:   hCG, Beta Chain, Quant, S 9,123 (*)    All other components within normal limits  URINALYSIS, ROUTINE W REFLEX MICROSCOPIC - Abnormal; Notable for the following components:   Color, Urine YELLOW (*)    APPearance HAZY (*)    Hgb urine dipstick LARGE (*)    Leukocytes,Ua TRACE (*)    Bacteria, UA RARE (*)    All other components within normal limits  POC URINE PREG, ED - Abnormal; Notable for the following components:   Preg Test, Ur Positive (*)    All other components within normal limits  CBC WITH DIFFERENTIAL/PLATELET  ABO/RH    RADIOLOGY  I personally viewed and evaluated these images as part of my medical decision making, as well as reviewing the written report by the radiologist.  ED Provider Interpretation: No cardiac activity of fetus  US  OB LESS THAN 14 WEEKS WITH OB TRANSVAGINAL Result Date: 10/25/2023 CLINICAL DATA:  Vaginal bleeding with positive pregnancy test, initial encounter  EXAM: OBSTETRIC <14 WK US  AND TRANSVAGINAL OB US  TECHNIQUE: Both transabdominal and transvaginal ultrasound examinations were performed for complete evaluation of the gestation as well as the maternal uterus, adnexal regions, and pelvic cul-de-sac. Transvaginal technique was performed to assess early pregnancy. COMPARISON:  None Available. FINDINGS: Intrauterine gestational sac: Present Yolk sac:  Present Embryo:  Present Cardiac Activity: Absent CRL:  11.7 mm Subchorionic hemorrhage:  None visualized. Maternal uterus/adnexae: Ovaries are within normal limits. IMPRESSION: Fetal pole without evidence of cardiac activity. Given the size, findings meet definitive criteria for failed pregnancy. This follows SRU consensus guidelines: Diagnostic Criteria for Nonviable Pregnancy Early in the First Trimester. [employer] J Med [phone]. Electronically Signed   By: Violeta Grey M.D.   On: 10/25/2023 20:57    PROCEDURES:  Critical Care performed:  No  Procedures   MEDICATIONS ORDERED IN ED: Medications - No data to display   IMPRESSION / MDM / ASSESSMENT AND PLAN / ED COURSE  I reviewed the triage vital signs and the nursing notes.                               43 y.o. female presents to the emergency department for evaluation and treatment of vaginal bleeding while pregnant. See HPI for further details.   Differential diagnosis includes, but is not limited to miscarriage, breakthrough bleeding, subchorionic hemorrhage, ectopic pregnancy  Patient's presentation is most consistent with acute complicated illness / injury requiring diagnostic workup.  Patient presents with vaginal bleeding for 5 days.  Lab work is reassuring.  Hemoglobin stable.  Patient is hemodynamically stable, denies heavy bleeding or passage of clots tissue.  Ultrasound reveals nonviable pregnancy in the first trimester given absence of cardiac activity.  Discussed current findings with patient and advised very close follow-up with OB/GYN for repeat imaging and beta-hCG trend.  ED return precautions discussed including increased bleeding or severe pain.  Patient understands and agreeable with this plan.  She is in stable condition for discharge at this time.  FINAL CLINICAL IMPRESSION(S) / ED DIAGNOSES   Final diagnoses:  Non-viable pregnancy   Rx / DC Orders   ED Discharge Orders     None        Note:  This document was prepared using Dragon voice recognition software and may include unintentional dictation errors.    Phyllis Breeze, Merrell Borsuk A, PA-C 10/25/23 2316    Twilla Galea, MD 10/29/23 [phone]

## 2023-10-25 NOTE — ED Triage Notes (Signed)
 Pt reports she has had vaginal bleeding x 5 days. Pt reports she is aprox 3 months pregnant, pt has not received any prenatal care yet. Pt reports she is having some lower back pain also.

## 2023-10-25 NOTE — ED Notes (Signed)
 Patient pregnancy test postive
